# Patient Record
Sex: Male | Born: 1995 | Race: White | Hispanic: No | Marital: Single | Smoking: Current every day smoker
Health system: Southern US, Community
[De-identification: ages and names within clinical notes are randomized; demographics above are authoritative.]

## PROBLEM LIST (undated history)

## (undated) DIAGNOSIS — R454 Irritability and anger: Secondary | ICD-10-CM

## (undated) DIAGNOSIS — F909 Attention-deficit hyperactivity disorder, unspecified type: Secondary | ICD-10-CM

## (undated) DIAGNOSIS — J302 Other seasonal allergic rhinitis: Secondary | ICD-10-CM

## (undated) DIAGNOSIS — F17200 Nicotine dependence, unspecified, uncomplicated: Secondary | ICD-10-CM

## (undated) DIAGNOSIS — F419 Anxiety disorder, unspecified: Secondary | ICD-10-CM

## (undated) HISTORY — DX: Anxiety disorder, unspecified: F41.9

## (undated) HISTORY — DX: Nicotine dependence, unspecified, uncomplicated: F17.200

## (undated) HISTORY — DX: Irritability and anger: R45.4

## (undated) HISTORY — DX: Other seasonal allergic rhinitis: J30.2

## (undated) HISTORY — DX: Attention-deficit hyperactivity disorder, unspecified type: F90.9

---
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
## 2014-07-30 ENCOUNTER — Ambulatory Visit (INDEPENDENT_AMBULATORY_CARE_PROVIDER_SITE_OTHER): Payer: 59 | Admitting: Psychiatry

## 2014-07-30 ENCOUNTER — Encounter (HOSPITAL_COMMUNITY): Payer: Self-pay | Admitting: Psychiatry

## 2014-07-30 VITALS — BP 124/78 | HR 89 | Ht 68.5 in | Wt 156.0 lb

## 2014-07-30 DIAGNOSIS — F6381 Intermittent explosive disorder: Secondary | ICD-10-CM | POA: Diagnosis not present

## 2014-07-30 DIAGNOSIS — F1910 Other psychoactive substance abuse, uncomplicated: Secondary | ICD-10-CM | POA: Diagnosis not present

## 2014-07-30 DIAGNOSIS — F901 Attention-deficit hyperactivity disorder, predominantly hyperactive type: Secondary | ICD-10-CM

## 2014-07-30 MED ORDER — RISPERIDONE 1 MG PO TABS: 1.0000 mg | ORAL_TABLET | Freq: Two times a day (BID) | ORAL | Status: DC

## 2014-07-30 MED ORDER — MIRTAZAPINE 15 MG PO TABS: 15.0000 mg | ORAL_TABLET | Freq: Every day | ORAL | Status: DC
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
## 2014-08-03 ENCOUNTER — Other Ambulatory Visit (HOSPITAL_COMMUNITY): Payer: 59 | Attending: Psychiatry | Admitting: Licensed Clinical Social Worker

## 2014-08-03 ENCOUNTER — Encounter (HOSPITAL_COMMUNITY): Payer: Self-pay | Admitting: Psychology

## 2014-08-03 DIAGNOSIS — F17210 Nicotine dependence, cigarettes, uncomplicated: Secondary | ICD-10-CM | POA: Insufficient documentation

## 2014-08-03 DIAGNOSIS — F329 Major depressive disorder, single episode, unspecified: Secondary | ICD-10-CM | POA: Insufficient documentation

## 2014-08-03 DIAGNOSIS — F1020 Alcohol dependence, uncomplicated: Secondary | ICD-10-CM | POA: Insufficient documentation

## 2014-08-03 DIAGNOSIS — F901 Attention-deficit hyperactivity disorder, predominantly hyperactive type: Secondary | ICD-10-CM | POA: Insufficient documentation

## 2014-08-03 DIAGNOSIS — F6381 Intermittent explosive disorder: Secondary | ICD-10-CM | POA: Insufficient documentation

## 2014-08-03 DIAGNOSIS — F1910 Other psychoactive substance abuse, uncomplicated: Secondary | ICD-10-CM | POA: Insufficient documentation

## 2014-08-06 ENCOUNTER — Encounter (HOSPITAL_COMMUNITY): Payer: Self-pay | Admitting: Psychiatry

## 2014-08-06 ENCOUNTER — Ambulatory Visit (INDEPENDENT_AMBULATORY_CARE_PROVIDER_SITE_OTHER): Payer: 59 | Admitting: Psychiatry

## 2014-08-06 ENCOUNTER — Encounter (HOSPITAL_COMMUNITY): Payer: Self-pay | Admitting: Medical

## 2014-08-06 ENCOUNTER — Other Ambulatory Visit (HOSPITAL_COMMUNITY): Payer: 59 | Admitting: Licensed Clinical Social Worker

## 2014-08-06 VITALS — BP 127/80 | HR 92 | Ht 68.5 in | Wt 156.2 lb

## 2014-08-06 VITALS — BP 127/80 | HR 92 | Resp 16 | Ht 68.5 in | Wt 156.2 lb

## 2014-08-06 DIAGNOSIS — F902 Attention-deficit hyperactivity disorder, combined type: Secondary | ICD-10-CM

## 2014-08-06 DIAGNOSIS — F1910 Other psychoactive substance abuse, uncomplicated: Secondary | ICD-10-CM

## 2014-08-06 DIAGNOSIS — F901 Attention-deficit hyperactivity disorder, predominantly hyperactive type: Secondary | ICD-10-CM

## 2014-08-06 DIAGNOSIS — F321 Major depressive disorder, single episode, moderate: Secondary | ICD-10-CM

## 2014-08-06 DIAGNOSIS — R454 Irritability and anger: Secondary | ICD-10-CM

## 2014-08-06 DIAGNOSIS — F1020 Alcohol dependence, uncomplicated: Secondary | ICD-10-CM | POA: Diagnosis present

## 2014-08-06 DIAGNOSIS — F6381 Intermittent explosive disorder: Secondary | ICD-10-CM

## 2014-08-06 DIAGNOSIS — F329 Major depressive disorder, single episode, unspecified: Secondary | ICD-10-CM | POA: Diagnosis not present

## 2014-08-06 DIAGNOSIS — F17200 Nicotine dependence, unspecified, uncomplicated: Secondary | ICD-10-CM

## 2014-08-06 DIAGNOSIS — F17210 Nicotine dependence, cigarettes, uncomplicated: Secondary | ICD-10-CM | POA: Diagnosis not present

## 2014-08-06 DIAGNOSIS — F1029 Alcohol dependence with unspecified alcohol-induced disorder: Secondary | ICD-10-CM

## 2014-08-06 MED ORDER — RISPERIDONE 1 MG PO TABS: 1.0000 mg | ORAL_TABLET | Freq: Two times a day (BID) | ORAL | Status: DC

## 2014-08-06 MED ORDER — MIRTAZAPINE 15 MG PO TABS: 15.0000 mg | ORAL_TABLET | Freq: Every day | ORAL | Status: DC
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
## 2014-08-07 ENCOUNTER — Encounter (HOSPITAL_COMMUNITY): Payer: Self-pay | Admitting: Licensed Clinical Social Worker

## 2014-08-07 NOTE — Progress Notes (Signed)
    Daily Group Progress Note  Program: CD-IOP   Group Time: 1-2:30  Participation Level: Active  Behavioral Response: Appropriate  Type of Therapy: Process Group  Topic: After checking in with sobriety dates, group members took turns sharing about recovery-related activities and challenges they faced since the last group on Wednesday. They also shared any urges or cravings they may have experienced.  Group members were openly engaged in suggestions and feedback. A new patient joined the group and introduced himself. One group member graduated today from the program. The patient graduating received accolades from his fellow group members. His wife and son came to celebrate the graduation. Two group members saw program director, Maryjean Morn. The second half of group focused on styles of communication. Each patient was able to demonstrate through hands-on experience the relative effectiveness of communicating assertively, aggressively, passive-aggressively, and passively. Patients participated in a listening activity which is a major component of communication. Most patients were engaged in activities.      Group Time: 2:45-4  Participation Level: None  Behavioral Response: Appropriate  Type of Therapy: Psycho-education Group  Topic: The second half of group focused on styles of communication. Each patient was able to demonstrate through hands-on experience the relative effectiveness of communicating assertively, aggressively, passive-aggressively, and passively. Patients participated in a listening activity which is a major component of communication. Most patients were engaged in activities.      Summary:Today was patient's first day in the program. He introduced himself to the group. He reported he has a pending DWI and 2 felonies. The patient reported he used to live in [place] but was now staying with his parents. He was using alcohol and Xanax. He participated in the  communication activities but was quiet. Group members showed him where there are young people AA meetings in the community. He was receptive to the information. His sobriety date is 7/1.   Family Program: Family present? No   Name of family member(s):   UDS collected: No Results:   AA/NA attended?: No  Sponsor?: No   Harjit Leider S, Licensed Cli

## 2014-08-07 NOTE — Progress Notes (Signed)
Daily Group Progress Note  Program: CD-IOP   Group Time: 1-2:30  Participation Level: Active  Behavioral Response: Appropriate  Type of Therapy: Process Group  Topic: After checking in with sobriety dates, group members took turns sharing about recovery-related activities and challenges they faced since the last group. They also shared any urges or cravings they may have experienced.  Group members were openly engaged in suggestions and feedback. A new group member met with program director, Darlyne Russian, [place].    Group Time: 2:45-4  Participation Level: Minimal  Behavioral Response: Appropriate  Type of Therapy: Psycho-education Group  Topic: The second half of group focused on Chemical Addiction and the family. Group members were able to acknowledge how their own addiction affected their family. Group members discovered emotions family members may have felt while they were in active addiction. Group members reported how the use of chemicals had dominated their thoughts, their time and their attention; and they didn't even realize it. There was good group discussion surrounding these emotions.    Summary: Patient reported a lot of family time over the weekend. He did have an urge to drink while watching the race on Sunday. He has not been to any meetings yet. He asked the group for assistance in [place] meetings for young people. Group gave good information to patient. Patient reported he has been to 1 AA meeting in his life. Group members gave him information on expectations at 12-step meetings. Patient participated minimally during the discussion on family and addiction. He had a psychiatrist appointment and an intake appointment with the CD-IOP program director Darlyne Russian, PA during group. Patient will begin to open up more the longer he is in the program. His sobriety date is 7/15.     Family Program: Family present? No   Name of family member(s):   UDS collected: No  Results:   AA/NA attended?: No  Sponsor?: No   Clayton Mason S, Licensed Cli

## 2014-08-08 ENCOUNTER — Encounter (HOSPITAL_COMMUNITY): Payer: Self-pay | Admitting: Licensed Clinical Social Worker

## 2014-08-08 ENCOUNTER — Other Ambulatory Visit (HOSPITAL_COMMUNITY): Payer: 59 | Admitting: Licensed Clinical Social Worker

## 2014-08-08 DIAGNOSIS — F1020 Alcohol dependence, uncomplicated: Secondary | ICD-10-CM | POA: Diagnosis not present

## 2014-08-08 DIAGNOSIS — F1011 Alcohol abuse, in remission: Secondary | ICD-10-CM | POA: Insufficient documentation

## 2014-08-09 ENCOUNTER — Other Ambulatory Visit (HOSPITAL_COMMUNITY): Payer: 59 | Admitting: Licensed Clinical Social Worker

## 2014-08-09 ENCOUNTER — Encounter (HOSPITAL_COMMUNITY): Payer: Self-pay | Admitting: Licensed Clinical Social Worker

## 2014-08-09 DIAGNOSIS — F1910 Other psychoactive substance abuse, uncomplicated: Secondary | ICD-10-CM

## 2014-08-09 DIAGNOSIS — F1020 Alcohol dependence, uncomplicated: Secondary | ICD-10-CM | POA: Diagnosis not present

## 2014-08-09 NOTE — Progress Notes (Signed)
    Daily Group Progress Note  Program: CD-IOP   Group Time: 1-2:30  Participation Level: Active  Behavioral Response: Appropriate and Sharing  Type of Therapy: Process Group  Topic: After checking in with sobriety dates, group members took turns sharing about recovery-related activities and challenges they faced since the last group. They also shared any urges or cravings they may have experienced.  Group members were openly engaged in suggestions and feedback. A new patient joined the group and introduced herself.    Group Time: 2:45-4  Participation Level: Active  Behavioral Response: Appropriate and Sharing  Type of Therapy: Psycho-education Group  Topic: The second part of group focused on trauma and substance abuse. A guest speaker, Dr. Fleeta Emmer, from [facility] presented through a power point how addiction predisposes people to higher rates of traumas. Inversely, people with trauma also have higher rates of addiction. Dr. Luiz Blare also discussed her foundation and what services are provided:  counseling, case management, skill-building groups, and co-occurring disorder counseling. She gave patients informational material on trauma.       Summary: Patient reported he went back to [place] on Tuesday because his girlfriend is back from [place]. He reported he went to an [employer] in [place]. He went to court today for a misdemeanor and he was fined. He had a craving to use after court. He went home and went to sleep to deal with the craving.  He reported he also has several pending felony charges and is worried about going to court again. Patient reports he will begin going to meetings in [place]. Patient did not participate in the trauma discussion. Patient is new in recovery. His sobriety date is 7/15.    Family Program: Family present? No   Name of family member(s):   UDS collected: No Results:   AA/NA attended?: No  Sponsor?:  No   Keithon Mccoin S, Licensed Cli

## 2014-08-10 ENCOUNTER — Encounter (HOSPITAL_COMMUNITY): Payer: Self-pay | Admitting: Licensed Clinical Social Worker

## 2014-08-10 ENCOUNTER — Other Ambulatory Visit (HOSPITAL_COMMUNITY): Payer: 59

## 2014-08-10 NOTE — Progress Notes (Signed)
    Daily Group Progress Note  Program: CD-IOP   Group Time: 1-2:30  Participation Level: Active  Behavioral Response: Appropriate and Sharing  Type of Therapy: Process Group  Topic: After checking in with sobriety dates, group members took turns sharing about recovery-related activities and challenges they faced since the last group. They also shared any urges or cravings they may have experienced.  Group members were openly engaged in suggestions and feedback. One patient is struggling with early sobriety. He received good feedback and suggestions from other group members. Two patients were given prescriptions to go to [facility] for drug tests.    Group Time: 2:45-4  Participation Level: Active  Behavioral Response: Appropriate and Sharing  Type of Therapy: Psycho-education Group  Topic: The second half of group was psychoeducation. The focus was family roles in an addicted family. Each member learned about roles family members take on when there is addiction in the family to create balance in a dysfunctional family. The roles: addict, hero, mascot, lost child, scapegoat and enabler. Next, group members displayed a family structure portraying what their family looked like metaphorically when the patient was between the ages of 8-10. The segment was very powerful as patients explained, by sculpture, what they remember about their early childhood years. Lots of feelings were shown. There was open and honest conversation about family.      Summary:  Patient honestly reported he has had urges to drink since the last group yesterday so he went to a meeting and saw another group member. He felt more comfortable seeing another group member. Patient participated in the family structure activity. He formed his structure with 6 family members: parents together, 2 brothers together, patient and his sister together. He reported his sister came to live in their family at age 83 and she was  shunned by his family. She began to run away and the patient was always looking for her. He portrayed this in his structure. He was emotional when he talked about his family. He participated well in the intervention. His sobriety date is 7/15.  Family Program: Family present? No   Name of family member(s):   UDS collected: No Results:   AA/NA attended?: YesWednesday  Sponsor?: No   Gradie Butrick S, Licensed Cli

## 2014-08-13 ENCOUNTER — Other Ambulatory Visit (HOSPITAL_COMMUNITY): Payer: 59 | Attending: Psychiatry | Admitting: Psychology

## 2014-08-13 DIAGNOSIS — F1020 Alcohol dependence, uncomplicated: Secondary | ICD-10-CM | POA: Insufficient documentation

## 2014-08-13 DIAGNOSIS — F1910 Other psychoactive substance abuse, uncomplicated: Secondary | ICD-10-CM | POA: Insufficient documentation

## 2014-08-13 DIAGNOSIS — F17210 Nicotine dependence, cigarettes, uncomplicated: Secondary | ICD-10-CM | POA: Diagnosis not present

## 2014-08-14 ENCOUNTER — Emergency Department (HOSPITAL_COMMUNITY): Payer: 59

## 2014-08-14 ENCOUNTER — Emergency Department (HOSPITAL_COMMUNITY)
Admission: EM | Admit: 2014-08-14 | Discharge: 2014-08-14 | Disposition: A | Discharge status: Home / Self Care | Payer: 59 | Attending: Emergency Medicine | Admitting: Emergency Medicine

## 2014-08-14 ENCOUNTER — Encounter (HOSPITAL_COMMUNITY): Payer: Self-pay | Admitting: Emergency Medicine

## 2014-08-14 DIAGNOSIS — S59902A Unspecified injury of left elbow, initial encounter: Secondary | ICD-10-CM | POA: Diagnosis not present

## 2014-08-14 DIAGNOSIS — F419 Anxiety disorder, unspecified: Secondary | ICD-10-CM | POA: Insufficient documentation

## 2014-08-14 DIAGNOSIS — S79911A Unspecified injury of right hip, initial encounter: Secondary | ICD-10-CM | POA: Diagnosis not present

## 2014-08-14 DIAGNOSIS — Y9389 Activity, other specified: Secondary | ICD-10-CM | POA: Insufficient documentation

## 2014-08-14 DIAGNOSIS — Z720 Tobacco use: Secondary | ICD-10-CM | POA: Diagnosis not present

## 2014-08-14 DIAGNOSIS — Z8709 Personal history of other diseases of the respiratory system: Secondary | ICD-10-CM | POA: Insufficient documentation

## 2014-08-14 DIAGNOSIS — Y999 Unspecified external cause status: Secondary | ICD-10-CM | POA: Diagnosis not present

## 2014-08-14 DIAGNOSIS — M25551 Pain in right hip: Secondary | ICD-10-CM

## 2014-08-14 DIAGNOSIS — Z79899 Other long term (current) drug therapy: Secondary | ICD-10-CM | POA: Diagnosis not present

## 2014-08-14 DIAGNOSIS — Y92410 Unspecified street and highway as the place of occurrence of the external cause: Secondary | ICD-10-CM | POA: Insufficient documentation

## 2014-08-14 MED ORDER — NAPROXEN 500 MG PO TABS: 500.0000 mg | ORAL_TABLET | Freq: Two times a day (BID) | ORAL | Status: DC

## 2014-08-14 MED ORDER — FENTANYL CITRATE (PF) 100 MCG/2ML IJ SOLN
25.0000 ug | Freq: Once | INTRAMUSCULAR | Status: AC
  Administered 2014-08-14: 25 ug via INTRAMUSCULAR
  Filled 2014-08-14: qty 2

## 2014-08-14 MED ORDER — OXYCODONE-ACETAMINOPHEN 5-325 MG PO TABS: 2.0000 | ORAL_TABLET | Freq: Three times a day (TID) | ORAL | Status: DC | PRN

## 2014-08-14 NOTE — Discharge Instructions (Signed)
Hip Pain Follow up with her primary care physician for repeat x-rays within 6 months. Your hip is the joint between your upper legs and your lower pelvis. The bones, cartilage, tendons, and muscles of your hip joint perform a lot of work each day supporting your body weight and allowing you to move around. Hip pain can range from a minor ache to severe pain in one or both of your hips. Pain may be felt on the inside of the hip joint near the groin, or the outside near the buttocks and upper thigh. You may have swelling or stiffness as well.  HOME CARE INSTRUCTIONS   Take medicines only as directed by your health care provider.  Apply ice to the injured area:  Put ice in a plastic bag.  Place a towel between your skin and the bag.  Leave the ice on for 15-20 minutes at a time, 3-4 times a day.  Keep your leg raised (elevated) when possible to lessen swelling.  Avoid activities that cause pain.  Follow specific exercises as directed by your health care provider.  Sleep with a pillow between your legs on your most comfortable side.  Record how often you have hip pain, the location of the pain, and what it feels like. SEEK MEDICAL CARE IF:   You are unable to put weight on your leg.  Your hip is red or swollen or very tender to touch.  Your pain or swelling continues or worsens after 1 week.  You have increasing difficulty walking.  You have a fever. SEEK IMMEDIATE MEDICAL CARE IF:   You have fallen.  You have a sudden increase in pain and swelling in your hip. MAKE SURE YOU:   Understand these instructions.  Will watch your condition.  Will get help right away if you are not doing well or get worse. Document Released: 06/18/2009 Document Revised: 05/15/2013 Document Reviewed: 08/25/2012 [facility] Patient Information 2015 [place], [place]. This information is not intended to replace advice given to you by your health care provider. Make sure you discuss any questions you  have with your health care provider.
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
## 2014-08-15 ENCOUNTER — Encounter (HOSPITAL_COMMUNITY): Payer: Self-pay | Admitting: Psychology

## 2014-08-15 ENCOUNTER — Other Ambulatory Visit (HOSPITAL_COMMUNITY): Payer: 59 | Admitting: Psychology

## 2014-08-15 NOTE — Progress Notes (Signed)
Daily Group Progress Note  Program: CD-IOP   Group Time: 1-2:30 pm  Participation Level: Active  Behavioral Response: Sharing  Type of Therapy: Process Group  Topic: Process; the first part of group was spent in process. Members shared bout any issues or concerns that had presented themselves in early recovery. During the check-in, members identified the things they had done to support their recovery over the weekend. The medical director met with patients today during this session.   Group Time: 2:45- 4pm  Participation Level: Active  Behavioral Response: Appropriate  Type of Therapy: Psycho-education Group/Graduation  Topic: Family Sculpture/Graduation: the second half of group was spent in a psycho-ed. This session was a continuation of the Family Sculpture that was begun last week. The 3 remaining members that had yet to complete these were asked to 'sculpt' their families to reflect the nature of their family at some time during their childhoods. For one member, it was a very painful sculpture of her childhood while for another; a very happy family was depicted with lots of love, support and validation. This session ended with a graduation ceremony honoring a member who was leaving the program successfully. Kind words were shared with the graduating member and he expressed his appreciation for what he had learned from his fellow group members and he encouraged them to continue on their journeys as he intends to.   Summary: The patient reported he had attended 2 AA meetings over the weekend. He had travelled to [place] to visit with his girlfriend and they had enjoyed their time together. He laughed as he described going out to dinner and wanting a steak only to find that they had gone to a Vegan restaurant. The patient shared that he had court dates in [place] on Wednesday and Thursday. He won't be in group on Wednesday, but his Thursday DWI date will be continued so he hopes  to get back here in time for the group session on Thursday. The patient was involved in the family sculptures and provided some good feedback about how it felt to play the role. He thanked the graduating member for being so welcoming and kind to him and wished him well.  The patient was attentive and engaged, but remains somewhat closed and withdrawn from sharing his feelings with his fellow group members. 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 He responded well to this intervention and his sobriety date is 7/15.    Family Program: Family present? No   Name of family member(s):   UDS collected: No Results:  AA/NA attended?: YesFriday and Saturday  Sponsor?: No   ,, LCAS        

## 2014-08-16 ENCOUNTER — Other Ambulatory Visit (HOSPITAL_COMMUNITY): Payer: 59 | Admitting: Psychology

## 2014-08-16 DIAGNOSIS — F1020 Alcohol dependence, uncomplicated: Secondary | ICD-10-CM | POA: Diagnosis not present

## 2014-08-17 ENCOUNTER — Encounter (HOSPITAL_COMMUNITY): Payer: Self-pay | Admitting: Psychology

## 2014-08-17 ENCOUNTER — Other Ambulatory Visit (HOSPITAL_COMMUNITY): Payer: 59

## 2014-08-17 ENCOUNTER — Other Ambulatory Visit (HOSPITAL_COMMUNITY): Payer: Self-pay | Admitting: *Deleted

## 2014-08-17 NOTE — Progress Notes (Signed)
Clayton Mason. is a 19 y.o. male patient. Orientation to CD-IOP: The patient is a 19 yo single, white, male seeking entry into the CD-IOP. He lives in [place] with his parents and younger brother.  The patient's father first contacted me about his son. Most recently, this young man had a DWI on July 1rst. The family had sought residential treatment for him, but he has current pending legal charges and was told by the DA that he cannot leave the area or enter any restricted residential facility until these cases are addressed. As a result, the patient has sought outpatient treatment and ended up here. (They did meet with the staff at [facility], but couldn't afford the $4,500 upfront fee).  The patient has an extensive history of alcohol and drug use that began in his mid-teens. He began drinking and smoking cannabis at age 33. He reports drinking 3-4 days per week while his cannabis use is daily. His other drug use, although extensive, is limited to much less frequent use. In addition to his addiction, though, the patient has a long history of behavioral outbursts that occur only when he is drinking. In addition to the pending DWI charge, he has 5 other charges pending that all occurred when he was drunk. The patient admitted that when he is drinking, he becomes argumentative, loud and will frequently end up fighting with subsequent legal charges. When asked about any periods of sobriety, the patient reported he had been totally sober for 2 months about a year ago. He explained that he was broke and couldn't afford anything. The patient's paternal side of the family is where the addiction resides, including his father, paternal grandfather and great grandmother, in addition to a long list of paternal uncles. The patient met with Dr. Salem Senate here at [facility] this past Monday, July 18th. 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  Today, the  patient reported that he is taking it as prescribed and is feeling better. He was sleepy the first few days, but noted that this side effect has subsided.  He is scheduled to meet with her again on Monday, the 25th. The patient reported he had not had anything to drink for a week, but admitted he smoked cannabis yesterday. I explained that this was a program of total abstinence and random drug tests will identify further drug use. The patient was pleasant and appeared very open about his drug use and the serious legal circumstances he now faces. The documentation was reviewed, signed and completed accordingly. He will return this afternoon and begin the CD-IOP.         Heiress Williamson, LCAS

## 2014-08-17 NOTE — Telephone Encounter (Signed)
Refill request for Risperidone. Chart reviewed, refill given 08/06/14, too soon for another refill.
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
## 2014-08-19 ENCOUNTER — Encounter (HOSPITAL_COMMUNITY): Payer: Self-pay | Admitting: Psychology

## 2014-08-19 DIAGNOSIS — F1020 Alcohol dependence, uncomplicated: Secondary | ICD-10-CM | POA: Insufficient documentation
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
## 2014-08-20 ENCOUNTER — Other Ambulatory Visit (HOSPITAL_COMMUNITY): Payer: 59 | Admitting: Psychology

## 2014-08-20 DIAGNOSIS — F1020 Alcohol dependence, uncomplicated: Secondary | ICD-10-CM

## 2014-08-20 DIAGNOSIS — F6381 Intermittent explosive disorder: Secondary | ICD-10-CM

## 2014-08-21 ENCOUNTER — Encounter (HOSPITAL_COMMUNITY): Payer: Self-pay | Admitting: Licensed Clinical Social Worker

## 2014-08-21 ENCOUNTER — Telehealth (HOSPITAL_COMMUNITY): Payer: Self-pay

## 2014-08-21 NOTE — Telephone Encounter (Signed)
Met with Binnie Rail from [facility] who stated patient had been unable to fill his Risperdal and Remeron Dr. Salem Senate prescribed on 08/06/14.  Patient reported he was not given a prescription on 08/06/14 for Risperdal and requested [facility].  [facility]ed [facility] and spoke with [place] who verified they had the Remeron order from that date but was too early to fill as he had filled this on 07/30/14.  Pharmacist did not have the Risperdal order and gave them the order by phone for Dr. Juliane Lack order dated 08/06/14 as authorized.  Patient instructed was too early to fill his Remeron but could in a week but could now pick up the Risperdal and will call back if any problems.

## 2014-08-22 ENCOUNTER — Other Ambulatory Visit (HOSPITAL_COMMUNITY): Payer: 59 | Admitting: Psychology

## 2014-08-22 DIAGNOSIS — F1020 Alcohol dependence, uncomplicated: Secondary | ICD-10-CM | POA: Diagnosis not present

## 2014-08-22 DIAGNOSIS — F6381 Intermittent explosive disorder: Secondary | ICD-10-CM

## 2014-08-22 NOTE — Telephone Encounter (Signed)
Thank you :)

## 2014-08-23 ENCOUNTER — Encounter (HOSPITAL_COMMUNITY): Payer: Self-pay | Admitting: Licensed Clinical Social Worker

## 2014-08-23 ENCOUNTER — Encounter (HOSPITAL_COMMUNITY): Payer: Self-pay | Admitting: Psychology

## 2014-08-23 NOTE — Progress Notes (Signed)
    Daily Group Progress Note  Program: CD-IOP   Group Time: 1-2:30 pm  Participation Level: Active  Behavioral Response: Sharing  Type of Therapy: Process Group  Topic: Process: the first part of group was spent in process. Members shared about the challenges of early recovery over this past weekend. Members are asked to identify what they did to support their recovery. While one member attended 4 12-step meetings, other members attended none. A difficult housing situation was discussed with her fellow group members providing support, good feedback and, most importantly, patience. During group today, the Market researcher met with the newest group member. She had left in the middle of last Monday's session because of a migraine and was unable to meet with him a scheduled.   Group Time: 2:45- 4pm  Participation Level: Active  Behavioral Response: Appropriate and Sharing  Type of Therapy: Psycho-education Group  Topic: Bio-Psycho-Social-Spiritual: the second half of group was spent in a psycho-ed on the 4 elements believed to contribute and lead to addiction. While everyone present in the group has the biological predisposition, the other 3 elements were quite different for each group member. Members discussed how each of these 4 elements had manifested in their lives. Of the 7 group members present today, only 1 member had not begun drinking/drugging in her early to mid-teens.   Summary: The patient reported he had gone to [place] to visit his girlfriend this weekend. His mother had taken him down to [place] on Friday and he had court. The case was continued. When asked about meetings, he stated he had attended 2 AA meetings at a [facility]. One meeting was a Big Book discussion while the other one was a Geophysicist/field seismologist. The patient also reported he had worked out for 90 minutes on Saturday and he had felt really good. He agreed with another member that he would follow a  consistent program of exercise throughout the week. During the second part of group today, the patient reported all of his friends had used alcohol and drugs beginning in high school. The only friends he had used and he didn't really know anybody that didn't use. When asked about his girlfriend's drug use, he admitted that she doesn't really drink, but she does smoke pot. He has asked her not to smoke around him and she has respected this request. The patient reported that a childhood friend of his visited with him this weekend. The friend noted how very different the patient is when he is drinking compared to when he is not drinking".  We talked about the psychological issues that contribute to addiction. This patient stated that his terrible problem with anger was inherited from his father. Another member wondered if perhaps he had just learned that way of dealing with frustration by watching his father's reactions and behaviors? Other members concurred, but this fellow repeated what his psychiatrist had taught him. The patient made some good comments and presented a little more openly about himself and feelings during group today. He responded well to this intervention and his sobriety date remains 7/15.    Family Program: Family present? No   Name of family member(s):   UDS collected: No Results:  AA/NA attended?: YesFriday and Saturday  Sponsor?: No   Keymarion Bearman, LCAS

## 2014-08-24 ENCOUNTER — Encounter (HOSPITAL_COMMUNITY): Payer: Self-pay | Admitting: Psychology

## 2014-08-24 ENCOUNTER — Other Ambulatory Visit (HOSPITAL_COMMUNITY): Payer: 59

## 2014-08-24 NOTE — Progress Notes (Signed)
Clayton Mason. is a 19 y.o. male patient. CD-IOP Family Counseling Session: Met with Pt and his father today to discuss his opiate medication given to him the emergency dept for pain. Pt has an appt later today with an orthopedic surgeon to discuss 2 bone spurs. The ED dr states they are either 2 bone spurs or 2 tumors. Pt reports he has taken all of the Percocet. He reports he has lots of pain. Told pt and father he can not be in our program and continue to take the pain meds. He will go to his dr appt today and report tomorrow in group.         Clayton Mason, Licensed Cli

## 2014-08-24 NOTE — Progress Notes (Signed)
Clayton Mason. is a 19 y.o. male patient. Discharge planning: Pt is being unsucessfully  Discharged from the CD-IOP program. He saw his orthopedic yesterday and he needs other tests. The dr gave him another prescription of percocet. Pt understands why he is being discharged and would like to return upon completion of medical issues.        Holly Iannaccone S, Licensed Cli

## 2014-08-24 NOTE — Progress Notes (Signed)
Daily Group Progress Note  Program: CD-IOP   Group Time: 1-2:30 pm  Participation Level: Active  Behavioral Response: Appropriate and Sharing  Type of Therapy: Psycho-education Group  Topic: Psycho-Ed: the first part of group was spent with a guest from the chaplaincy department. He was introduced and group members introduced themselves to our guest. He shared about having come here to speak with groups in the past and how, as a recovering addict, he enjoyed being with this group. The guest went on to explain about feelings and how "everything you feel is going to come out. One will either act it out or tell it". The problem is that we often 'act them out' and acting them out is usually confusing. He suggested that by verbalizing our feelings we avoid the 'explosions' that prove so painful and damaging in our lives and relationships. The exercise he led included passing around a small polished rock and saying to the person next to you, "Sometimes I pretend to.". It proved to be an extremely powerful session.  Group Time: 2:45- 4pm  Participation Level: Active  Behavioral Response: Appropriate  Type of Therapy: Process Group  Topic: Process: the second half of group was spent in process. Members shared about their experience with the chaplain and what they discovered and felt during the session. One member had actually been too frightened to share anything. The importance of disclosure, of opening up what one has kept in, is the therapeutic process that begins to 'free' one from the chains of addiction. It will be essential to open up to allow the recovery process to begin.   Summary: The patient was attentive and engaged in the session with the chaplain. When the rock came to him, the patient looked at the member next to him and shared, "I am angry at myself for what I have done with my life. And I am really scared about what is going to happen to me". It was a powerful statement and  the most revealing he has made since entering the program. 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 The surgeon was going to schedule the patient for an MRI and then they would meet again to discuss treatment options. The physician did not want to actually diagnose what exactly is going on with the patient and explained that with the MRI, they would know more exactly just what they are dealing with ,. The patient admitted he had been prescribed hydrocodone, but that "I had told him that I was in this program", but he prescribed the meds because I am hurting so much.  He admitted that he had taken a pill last night. I explained that we were certainly not opposed to him taking medication to address his pain, but that he could not remain in this program while taking the medication. We agreed that he would be discharged at this time, but that he would return when he is no longer on the meds or the treatment does not entail narcotics. The patient reported, "I really need this program and I want to come back". His group member urged him to remain in contact and that they wanted to know what was going on with him. The patient  promised them that he would remain in contact with his counselor and keep them informed. The patient will be discharged from this program, but he will be allowed  to return once he is no longer taking narcotic pain medications. The patient had announced that his sobriety date remained 7/15, but he had taken the pain medication before consulting [place] and that would be regarded as a slip. We will wait to hear from him.   Family Program: Family present? No   Name of family member(s):   UDS collected: No Results:  AA/NA attended?: No  Sponsor?: No   Clayton Mason, LCAS

## 2014-08-27 ENCOUNTER — Other Ambulatory Visit (HOSPITAL_COMMUNITY): Payer: 59

## 2014-08-29 ENCOUNTER — Other Ambulatory Visit (HOSPITAL_COMMUNITY): Payer: 59

## 2014-08-31 ENCOUNTER — Other Ambulatory Visit (HOSPITAL_COMMUNITY): Payer: 59

## 2014-09-03 ENCOUNTER — Other Ambulatory Visit (HOSPITAL_COMMUNITY): Payer: 59

## 2014-09-05 ENCOUNTER — Other Ambulatory Visit (HOSPITAL_COMMUNITY): Payer: 59

## 2014-09-07 ENCOUNTER — Other Ambulatory Visit (HOSPITAL_COMMUNITY): Payer: 59

## 2014-09-10 ENCOUNTER — Other Ambulatory Visit (HOSPITAL_COMMUNITY): Payer: 59

## 2014-09-12 ENCOUNTER — Other Ambulatory Visit (HOSPITAL_COMMUNITY): Payer: 59

## 2014-09-14 ENCOUNTER — Other Ambulatory Visit (HOSPITAL_COMMUNITY): Payer: 59

## 2014-09-18 ENCOUNTER — Ambulatory Visit (HOSPITAL_COMMUNITY): Payer: Self-pay | Admitting: Psychiatry

## 2014-09-19 ENCOUNTER — Other Ambulatory Visit (HOSPITAL_COMMUNITY): Payer: 59

## 2014-09-21 ENCOUNTER — Other Ambulatory Visit (HOSPITAL_COMMUNITY): Payer: 59

## 2014-09-24 ENCOUNTER — Other Ambulatory Visit (HOSPITAL_COMMUNITY): Payer: 59

## 2014-09-26 ENCOUNTER — Other Ambulatory Visit (HOSPITAL_COMMUNITY): Payer: 59

## 2014-09-28 ENCOUNTER — Other Ambulatory Visit (HOSPITAL_COMMUNITY): Payer: 59

## 2014-10-01 ENCOUNTER — Other Ambulatory Visit (HOSPITAL_COMMUNITY): Payer: 59

## 2014-10-03 ENCOUNTER — Other Ambulatory Visit (HOSPITAL_COMMUNITY): Payer: 59

## 2014-10-05 ENCOUNTER — Other Ambulatory Visit (HOSPITAL_COMMUNITY): Payer: 59

## 2014-10-08 ENCOUNTER — Other Ambulatory Visit (HOSPITAL_COMMUNITY): Payer: 59

## 2014-10-10 ENCOUNTER — Other Ambulatory Visit (HOSPITAL_COMMUNITY): Payer: 59

## 2014-10-12 ENCOUNTER — Other Ambulatory Visit (HOSPITAL_COMMUNITY): Payer: 59

## 2014-10-15 ENCOUNTER — Other Ambulatory Visit (HOSPITAL_COMMUNITY): Payer: 59

## 2014-10-17 ENCOUNTER — Other Ambulatory Visit (HOSPITAL_COMMUNITY): Payer: 59

## 2014-10-19 ENCOUNTER — Other Ambulatory Visit (HOSPITAL_COMMUNITY): Payer: 59

## 2014-10-22 ENCOUNTER — Other Ambulatory Visit (HOSPITAL_COMMUNITY): Payer: 59

## 2014-10-24 ENCOUNTER — Other Ambulatory Visit (HOSPITAL_COMMUNITY): Payer: 59

## 2014-10-26 ENCOUNTER — Other Ambulatory Visit (HOSPITAL_COMMUNITY): Payer: 59

## 2014-10-29 ENCOUNTER — Other Ambulatory Visit (HOSPITAL_COMMUNITY): Payer: 59

## 2014-10-31 ENCOUNTER — Other Ambulatory Visit (HOSPITAL_COMMUNITY): Payer: 59

## 2014-11-02 ENCOUNTER — Other Ambulatory Visit (HOSPITAL_COMMUNITY): Payer: 59

## 2015-05-03 ENCOUNTER — Ambulatory Visit (INDEPENDENT_AMBULATORY_CARE_PROVIDER_SITE_OTHER): Payer: 59 | Admitting: Family Medicine

## 2015-05-03 ENCOUNTER — Encounter: Payer: Self-pay | Admitting: Family Medicine

## 2015-05-03 VITALS — BP 130/84 | HR 92 | Temp 98.3°F | Wt 166.8 lb

## 2015-05-03 DIAGNOSIS — F1011 Alcohol abuse, in remission: Secondary | ICD-10-CM

## 2015-05-03 DIAGNOSIS — F901 Attention-deficit hyperactivity disorder, predominantly hyperactive type: Secondary | ICD-10-CM | POA: Diagnosis not present

## 2015-05-03 DIAGNOSIS — F1010 Alcohol abuse, uncomplicated: Secondary | ICD-10-CM

## 2015-05-03 DIAGNOSIS — F6381 Intermittent explosive disorder: Secondary | ICD-10-CM

## 2015-05-03 DIAGNOSIS — F321 Major depressive disorder, single episode, moderate: Secondary | ICD-10-CM | POA: Diagnosis not present

## 2015-05-03 DIAGNOSIS — Z720 Tobacco use: Secondary | ICD-10-CM | POA: Diagnosis not present

## 2015-05-03 DIAGNOSIS — Z113 Encounter for screening for infections with a predominantly sexual mode of transmission: Secondary | ICD-10-CM

## 2015-05-03 DIAGNOSIS — F17200 Nicotine dependence, unspecified, uncomplicated: Secondary | ICD-10-CM

## 2015-05-03 MED ORDER — MIRTAZAPINE 15 MG PO TABS: 15.0000 mg | ORAL_TABLET | Freq: Every day | ORAL | Status: DC

## 2015-05-03 MED ORDER — AMITRIPTYLINE HCL 50 MG PO TABS: 50.0000 mg | ORAL_TABLET | Freq: Two times a day (BID) | ORAL | Status: DC
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
## 2015-05-03 NOTE — Progress Notes (Signed)
Pre visit review using our clinic review tool, if applicable. No additional management support is needed unless otherwise documented below in the visit note. 

## 2015-05-03 NOTE — Assessment & Plan Note (Signed)
Completed 28d rehab 01/2015. Abstinent from EtOH. Doing well. Encouraged full cessation.

## 2015-05-03 NOTE — Patient Instructions (Addendum)
I'm glad you're doing well today. Continue medicines - refilled for 1 month with several refills. Return to see Dr Rutherford Limerickadepalli to touch base. If she feels things are stable, we can continue following you here.  labwork and urine check today.  Return as needed or in 1 year for a physical.

## 2015-05-03 NOTE — Addendum Note (Signed)
Addended by: Alvina ChouWALSH, Maydelin Deming J on: 05/03/2015 10:03 AM   Modules accepted: Orders

## 2015-05-03 NOTE — Assessment & Plan Note (Signed)
Contemplative. Continue to encourage cessation. Consider wellbutrin.

## 2015-05-03 NOTE — Assessment & Plan Note (Signed)
With anxiety - meds refilled. Suggested return for f/u with psych and if deemed stable ok to continue f/u with PCP. Pt and dad agree with plan.

## 2015-05-03 NOTE — Assessment & Plan Note (Signed)
Suggested f/u with psych.

## 2015-05-03 NOTE — Assessment & Plan Note (Signed)
Stable period. Suggested f/u with psych

## 2015-05-04 LAB — GC/CHLAMYDIA PROBE AMP
CT Probe RNA: NOT DETECTED
GC Probe RNA: NOT DETECTED

## 2015-05-04 LAB — RPR

## 2015-05-04 LAB — HIV ANTIBODY (ROUTINE TESTING W REFLEX): HIV: NONREACTIVE

## 2015-05-06 ENCOUNTER — Encounter: Payer: Self-pay | Admitting: *Deleted

## 2015-05-20 ENCOUNTER — Other Ambulatory Visit: Payer: Self-pay | Admitting: *Deleted

## 2015-05-20 MED ORDER — AMITRIPTYLINE HCL 50 MG PO TABS: 50.0000 mg | ORAL_TABLET | Freq: Two times a day (BID) | ORAL | Status: DC

## 2015-05-20 NOTE — Telephone Encounter (Signed)
Ok to refill to mail order? 

## 2015-05-21 ENCOUNTER — Other Ambulatory Visit: Payer: Self-pay | Admitting: *Deleted

## 2015-05-21 MED ORDER — MIRTAZAPINE 15 MG PO TABS: 15.0000 mg | ORAL_TABLET | Freq: Every day | ORAL | Status: DC

## 2015-05-21 NOTE — Telephone Encounter (Signed)
Sent in one refill. I did ask pt return to Dr Rutherford Limerickadepalli and if ok by her we could continue meds.

## 2015-05-21 NOTE — Telephone Encounter (Signed)
Ok to refill to mail order? 

## 2015-10-15 ENCOUNTER — Other Ambulatory Visit: Payer: Self-pay | Admitting: Family Medicine

## 2015-10-16 NOTE — Telephone Encounter (Signed)
Ok to refill? Last filled 05/21/15 #90 1 RF

## 2016-03-03 ENCOUNTER — Encounter (HOSPITAL_COMMUNITY): Payer: Self-pay | Admitting: Family Medicine

## 2016-03-03 ENCOUNTER — Ambulatory Visit (HOSPITAL_COMMUNITY)
Admission: EM | Admit: 2016-03-03 | Discharge: 2016-03-03 | Disposition: A | Discharge status: Home / Self Care | Payer: 59 | Attending: Internal Medicine | Admitting: Internal Medicine

## 2016-03-03 DIAGNOSIS — Z825 Family history of asthma and other chronic lower respiratory diseases: Secondary | ICD-10-CM | POA: Insufficient documentation

## 2016-03-03 DIAGNOSIS — Z811 Family history of alcohol abuse and dependence: Secondary | ICD-10-CM | POA: Diagnosis not present

## 2016-03-03 DIAGNOSIS — Z818 Family history of other mental and behavioral disorders: Secondary | ICD-10-CM | POA: Diagnosis not present

## 2016-03-03 DIAGNOSIS — Z823 Family history of stroke: Secondary | ICD-10-CM | POA: Insufficient documentation

## 2016-03-03 DIAGNOSIS — R109 Unspecified abdominal pain: Secondary | ICD-10-CM | POA: Insufficient documentation

## 2016-03-03 DIAGNOSIS — Z79899 Other long term (current) drug therapy: Secondary | ICD-10-CM | POA: Diagnosis not present

## 2016-03-03 DIAGNOSIS — J302 Other seasonal allergic rhinitis: Secondary | ICD-10-CM | POA: Diagnosis not present

## 2016-03-03 DIAGNOSIS — Z800 Family history of malignant neoplasm of digestive organs: Secondary | ICD-10-CM | POA: Diagnosis not present

## 2016-03-03 DIAGNOSIS — F329 Major depressive disorder, single episode, unspecified: Secondary | ICD-10-CM | POA: Diagnosis not present

## 2016-03-03 DIAGNOSIS — Z808 Family history of malignant neoplasm of other organs or systems: Secondary | ICD-10-CM | POA: Insufficient documentation

## 2016-03-03 DIAGNOSIS — Z803 Family history of malignant neoplasm of breast: Secondary | ICD-10-CM | POA: Insufficient documentation

## 2016-03-03 DIAGNOSIS — Z8051 Family history of malignant neoplasm of kidney: Secondary | ICD-10-CM | POA: Insufficient documentation

## 2016-03-03 DIAGNOSIS — Z8249 Family history of ischemic heart disease and other diseases of the circulatory system: Secondary | ICD-10-CM | POA: Insufficient documentation

## 2016-03-03 DIAGNOSIS — F1010 Alcohol abuse, uncomplicated: Secondary | ICD-10-CM | POA: Diagnosis not present

## 2016-03-03 DIAGNOSIS — F17210 Nicotine dependence, cigarettes, uncomplicated: Secondary | ICD-10-CM | POA: Diagnosis not present

## 2016-03-03 DIAGNOSIS — K529 Noninfective gastroenteritis and colitis, unspecified: Secondary | ICD-10-CM | POA: Insufficient documentation

## 2016-03-03 DIAGNOSIS — F909 Attention-deficit hyperactivity disorder, unspecified type: Secondary | ICD-10-CM | POA: Insufficient documentation

## 2016-03-03 DIAGNOSIS — F419 Anxiety disorder, unspecified: Secondary | ICD-10-CM | POA: Diagnosis not present

## 2016-03-03 DIAGNOSIS — R1110 Vomiting, unspecified: Secondary | ICD-10-CM | POA: Insufficient documentation
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
## 2016-03-03 NOTE — ED Triage Notes (Signed)
Pt here for N,V, D for a few days. sts also that he wants an STD check denise symptoms.

## 2016-03-03 NOTE — ED Provider Notes (Signed)
[facility]    CSN: [id number] Arrival date & time: 03/03/16  1805     History   Chief Complaint Chief Complaint  Patient presents with  . Emesis  . Abdominal Cramping  . Diarrhea    HPI Clayton Leiternthony D Clinger Jr. is a 21 y.o. male.   C/o generalized abdominal pain and nausea and vomiting.  A few episodes of loose stool.  Denies blood in emesis or diarrhea.  Incidentally wants STD testing while here.  No penile discharge.      Past Medical History:  Diagnosis Date  . ADHD (attention deficit hyperactivity disorder)   . Anxiety   . Seasonal allergic rhinitis 2008   s/p allergy shots  . Smoker   . Unable to control anger     Patient Active Problem List   Diagnosis Date Noted  . MDD (major depressive disorder), single episode, moderate (HCC) 05/03/2015  . History of alcohol abuse 08/08/2014  . Intermittent explosive disorder 07/30/2014  . ADHD (attention deficit hyperactivity disorder), predominantly hyperactive impulsive type 07/30/2014  . Seasonal allergic rhinitis   . Smoker     History reviewed. No pertinent surgical history.     Home Medications    Prior to Admission medications   Medication Sig Start Date End Date Taking? Authorizing Provider  amitriptyline (ELAVIL) 50 MG tablet Take 1 tablet (50 mg total) by mouth 2 (two) times daily. For anxiety 05/20/15   Eustaquio BoydenJavier Burrows, MD  cetirizine (ZYRTEC) 10 MG tablet Take 10 mg by mouth daily.    Historical Provider, MD  mirtazapine (REMERON) 15 MG tablet TAKE 1 TABLET BY MOUTH AT  BEDTIME 10/17/15   Eustaquio BoydenJavier Seier, MD    Family History Family History  Problem Relation Age of Onset  . Asthma Father   . Hearing loss Father   . Hypertension Father   . Drug abuse Father   . Alcohol abuse Father   . Anxiety disorder Father   . Cancer Other     breast (paternal great aunt)  . Cancer Maternal Grandfather     kidney  . Cancer Paternal Grandmother     bone  . COPD Paternal Grandmother   .  Cancer Paternal Grandfather [id number]    colon  . Alcohol abuse Paternal Grandfather   . Bipolar disorder Sister   . Anxiety disorder Mother   . ADD / ADHD Brother   . CAD Neg Hx   . Stroke Neg Hx     Social History Social History  Substance Use Topics  . Smoking status: Current Every Day Smoker    Packs/day: 0.40    Types: Cigarettes  . Smokeless tobacco: Never Used  . Alcohol use 21.0 oz/week    20 Cans of beer, 15 Shots of liquor per week     Allergies   Bactrim [sulfamethoxazole-trimethoprim]   Review of Systems Review of Systems  Constitutional: Negative for chills and fever.  HENT: Negative for sore throat and tinnitus.   Eyes: Negative for redness.  Respiratory: Negative for cough and shortness of breath.   Cardiovascular: Negative for chest pain and palpitations.  Gastrointestinal: Positive for abdominal pain, diarrhea, nausea and vomiting.  Genitourinary: Negative for dysuria, frequency and urgency.  Musculoskeletal: Negative for myalgias.  Skin: Negative for rash.       No lesions  Neurological: Negative for weakness.  Hematological: Does not bruise/bleed easily.  Psychiatric/Behavioral: Negative for suicidal ideas.     Physical Exam Triage Vital Signs ED Triage Vitals [03/03/16 1909]  Enc Vitals Group     BP 128/82     Pulse Rate 89     Resp 18     Temp 98.2 F (36.8 C)     Temp src      SpO2 100 %     Weight      Height      Head Circumference      Peak Flow      Pain Score      Pain Loc      Pain Edu?      Excl. in GC?    No data found.   Updated Vital Signs BP 128/82   Pulse 89   Temp 98.2 F (36.8 C)   Resp 18   SpO2 100%   Visual Acuity Right Eye Distance:   Left Eye Distance:   Bilateral Distance:    Right Eye Near:   Left Eye Near:    Bilateral Near:     Physical Exam  Constitutional: He is oriented to person, place, and time. He appears well-developed and well-nourished. No distress.  HENT:  Head: Normocephalic and  atraumatic.  Mouth/Throat: Oropharynx is clear and moist.  Eyes: Conjunctivae and EOM are normal. Pupils are equal, round, and reactive to light. No scleral icterus.  Neck: Normal range of motion. Neck supple. No JVD present. No tracheal deviation present. No thyromegaly present.  Cardiovascular: Normal rate, regular rhythm and normal heart sounds.  Exam reveals no gallop and no friction rub.   No murmur heard. Pulmonary/Chest: Effort normal and breath sounds normal. No respiratory distress.  Abdominal: Soft. Bowel sounds are normal. He exhibits no distension. There is no tenderness.  Musculoskeletal: Normal range of motion. He exhibits no edema.  Lymphadenopathy:    He has no cervical adenopathy.  Neurological: He is alert and oriented to person, place, and time. No cranial nerve deficit.  Skin: Skin is warm and dry. No rash noted. No erythema.  Psychiatric: He has a normal mood and affect. His behavior is normal. Judgment and thought content normal.     UC Treatments / Results  Labs (all labs ordered are listed, but only abnormal results are displayed) Labs Reviewed  RPR  HIV ANTIBODY (ROUTINE TESTING)  URINE CYTOLOGY ANCILLARY ONLY    EKG  EKG Interpretation None       Radiology No results found.  Procedures Procedures (including critical care time)  Medications Ordered in UC Medications - No data to display   Initial Impression / Assessment and Plan / UC Course  I have reviewed the triage vital signs and the nursing notes.  Pertinent labs & imaging results that were available during my care of the patient were reviewed by me and considered in my medical decision making (see chart for details).     Likely viral gastroenteritis.  F/u STD testing.  Instructed to go to the ED if unable to hold down fluids x36 hours  Final Clinical Impressions(s) / UC Diagnoses   Final diagnoses:  Gastroenteritis    New Prescriptions Discharge Medication List as of 03/03/2016   8:06 PM       Arnaldo Natal, MD 03/03/16 2105

## 2016-03-04 LAB — URINE CYTOLOGY ANCILLARY ONLY
Chlamydia: NEGATIVE
Neisseria Gonorrhea: NEGATIVE

## 2016-03-05 LAB — HIV ANTIBODY (ROUTINE TESTING W REFLEX): HIV Screen 4th Generation wRfx: NONREACTIVE

## 2016-03-05 LAB — RPR: RPR Ser Ql: NONREACTIVE

## 2016-03-30 ENCOUNTER — Encounter (HOSPITAL_COMMUNITY): Payer: Self-pay | Admitting: Emergency Medicine

## 2016-03-30 ENCOUNTER — Emergency Department (HOSPITAL_COMMUNITY)
Admission: EM | Admit: 2016-03-30 | Discharge: 2016-03-30 | Disposition: A | Discharge status: Home / Self Care | Payer: 59 | Attending: Emergency Medicine | Admitting: Emergency Medicine

## 2016-03-30 ENCOUNTER — Other Ambulatory Visit: Payer: Self-pay | Admitting: Family Medicine

## 2016-03-30 DIAGNOSIS — F909 Attention-deficit hyperactivity disorder, unspecified type: Secondary | ICD-10-CM | POA: Insufficient documentation

## 2016-03-30 DIAGNOSIS — W01198A Fall on same level from slipping, tripping and stumbling with subsequent striking against other object, initial encounter: Secondary | ICD-10-CM | POA: Diagnosis not present

## 2016-03-30 DIAGNOSIS — S0990XA Unspecified injury of head, initial encounter: Secondary | ICD-10-CM | POA: Diagnosis present

## 2016-03-30 DIAGNOSIS — Y999 Unspecified external cause status: Secondary | ICD-10-CM | POA: Diagnosis not present

## 2016-03-30 DIAGNOSIS — Y929 Unspecified place or not applicable: Secondary | ICD-10-CM | POA: Insufficient documentation

## 2016-03-30 DIAGNOSIS — Z79899 Other long term (current) drug therapy: Secondary | ICD-10-CM | POA: Insufficient documentation

## 2016-03-30 DIAGNOSIS — F17210 Nicotine dependence, cigarettes, uncomplicated: Secondary | ICD-10-CM | POA: Insufficient documentation

## 2016-03-30 DIAGNOSIS — S060X0A Concussion without loss of consciousness, initial encounter: Secondary | ICD-10-CM | POA: Diagnosis not present

## 2016-03-30 DIAGNOSIS — Y939 Activity, unspecified: Secondary | ICD-10-CM | POA: Diagnosis not present

## 2016-03-30 MED ORDER — OXYCODONE-ACETAMINOPHEN 5-325 MG PO TABS
1.0000 | ORAL_TABLET | Freq: Once | ORAL | Status: AC
  Administered 2016-03-30: 1 via ORAL
  Filled 2016-03-30 (×2): qty 1
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
## 2016-03-31 NOTE — Telephone Encounter (Signed)
Ok to refill? Last filled 10/17/15 #90 1RF

## 2016-04-08 ENCOUNTER — Encounter: Payer: Self-pay | Admitting: Family Medicine

## 2016-04-08 ENCOUNTER — Encounter: Payer: Self-pay | Admitting: Emergency Medicine

## 2016-04-08 ENCOUNTER — Ambulatory Visit (INDEPENDENT_AMBULATORY_CARE_PROVIDER_SITE_OTHER): Payer: 59 | Admitting: Family Medicine

## 2016-04-08 VITALS — Temp 98.5°F | Wt 155.0 lb

## 2016-04-08 DIAGNOSIS — S060X0D Concussion without loss of consciousness, subsequent encounter: Secondary | ICD-10-CM | POA: Diagnosis not present

## 2016-04-08 NOTE — Progress Notes (Signed)
Pre visit review using our clinic review tool, if applicable. No additional management support is needed unless otherwise documented below in the visit note. 

## 2016-04-08 NOTE — Progress Notes (Signed)
Subjective:    Patient ID: Clayton LeiterAnthony D Rufer Jr., male    DOB: 12/12/1995, 21 y.o.   MRN: [id number]  HPI This is a 21 yo male who presents today for follow up of ER visit from 03/30/2016. He was diagnosed with concussion without loss of consciousness. He denies headaches, dizziness, lightheadedness, visual changes, nausea or vomiting. Has not been drinking alcohol. Feels well. 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 He enjoys his work.  Smokes about 1/2 ppd. Trying to cut back.   Past Medical History:  Diagnosis Date  . ADHD (attention deficit hyperactivity disorder)   . Anxiety   . Seasonal allergic rhinitis 2008   s/p allergy shots  . Smoker   . Unable to control anger    No past surgical history on file. Family History  Problem Relation Age of Onset  . Asthma Father   . Hearing loss Father   . Hypertension Father   . Drug abuse Father   . Alcohol abuse Father   . Anxiety disorder Father   . Cancer Other     breast (paternal great aunt)  . Cancer Maternal Grandfather     kidney  . Cancer Paternal Grandmother     bone  . COPD Paternal Grandmother   . Cancer Paternal Grandfather [id number]    colon  . Alcohol abuse Paternal Grandfather   . Bipolar disorder Sister   . Anxiety disorder Mother   . ADD / ADHD Brother   . CAD Neg Hx   . Stroke Neg Hx    Social History  Substance Use Topics  . Smoking status: Current Every Day Smoker    Packs/day: 0.40    Types: Cigarettes  . Smokeless tobacco: Never Used  . Alcohol use 21.0 oz/week    20 Cans of beer, 15 Shots of liquor per week      Review of Systems  Constitutional: Negative for fatigue.  Eyes: Negative for visual disturbance.  Respiratory: Negative for shortness of breath.   Cardiovascular: Negative for chest pain.  Musculoskeletal: Negative for arthralgias, myalgias and neck pain.  Neurological: Negative for dizziness, weakness, light-headedness and headaches.       Objective:   Physical Exam  Constitutional: He is oriented to person, place, and time. He appears well-developed and well-nourished. No distress.  HENT:  Head: Normocephalic and atraumatic.  Eyes: Conjunctivae and EOM are normal. Pupils are equal, round, and reactive to light. Right eye exhibits no discharge. Left eye exhibits no discharge.  Neck: Normal range of motion. Neck supple.  Cardiovascular: Normal rate, regular rhythm and normal heart sounds.   Pulmonary/Chest: Effort normal and breath sounds normal.  Musculoskeletal: Normal range of motion. He exhibits no edema.  Neurological: He is alert and oriented to person, place, and time.  Cranial nerves: CN I: not tested CN II: pupils equal, round and reactive to light. CN III, IV, VI: full range of motion, no nystagmus, no ptosis CN V: facial sensation intact CN VII: upper and lower face symmetric CN VIII: hearing intact CN IX, X: gag intact, uvula midline CN XI: sternocleidomastoid and trapezius muscles intact CN XII: tongue midline Bulk & Tone: normal, no fasciculations. Motor: 5/5 throughout. Deep Tendon Reflexes: 2+ throughout.  Finger to nose testing: Without dysmetria.  Gait: Normal station and stride.  Skin: Skin is warm and dry. He is not diaphoretic.  Bilateral palms with healing abrasions.   Psychiatric: He has a normal mood and affect.  His behavior is normal. Judgment and thought content normal.  Vitals reviewed.     Temp 98.5 F (36.9 C) (Oral)   Wt 155 lb (70.3 kg)   SpO2 100%   BMI 22.89 kg/m  BP 118/80     Assessment & Plan:  1. Concussion without loss of consciousness, subsequent encounter - no abnormal history or findings on exam - letter provided to return to work   Olean Ree, FNP-BC   [facility] [place], [facility]  04/08/2016 5:21 PM

## 2016-04-17 ENCOUNTER — Ambulatory Visit (INDEPENDENT_AMBULATORY_CARE_PROVIDER_SITE_OTHER): Payer: 59 | Admitting: Family Medicine

## 2016-04-17 ENCOUNTER — Ambulatory Visit (INDEPENDENT_AMBULATORY_CARE_PROVIDER_SITE_OTHER)
Admission: RE | Admit: 2016-04-17 | Discharge: 2016-04-17 | Disposition: A | Discharge status: Home / Self Care | Payer: 59 | Source: Ambulatory Visit | Attending: Family Medicine | Admitting: Family Medicine

## 2016-04-17 ENCOUNTER — Encounter: Payer: Self-pay | Admitting: Family Medicine

## 2016-04-17 VITALS — BP 104/66 | HR 86 | Temp 97.6°F | Wt 155.0 lb

## 2016-04-17 DIAGNOSIS — M79672 Pain in left foot: Secondary | ICD-10-CM | POA: Diagnosis not present

## 2016-04-17 DIAGNOSIS — F17200 Nicotine dependence, unspecified, uncomplicated: Secondary | ICD-10-CM

## 2016-04-17 MED ORDER — AMITRIPTYLINE HCL 50 MG PO TABS: 50.0000 mg | ORAL_TABLET | Freq: Two times a day (BID) | ORAL | 1 refills | Status: AC
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
## 2016-04-17 NOTE — Patient Instructions (Signed)
I think you had bony contusion of heel. Use ASO ankle brace for further support for next few weeks. Continue tylenol or ibuprofen for discomfort. If not better, we will refer you to sports medicine doctor.

## 2016-04-17 NOTE — Progress Notes (Signed)
Pre visit review using our clinic review tool, if applicable. No additional management support is needed unless otherwise documented below in the visit note. 

## 2016-04-17 NOTE — Assessment & Plan Note (Signed)
Exam today concerning for calcaneal fracture - but xrays clear on my read. ?bony contusion - rec treat with NSAID, ASO brace for support, ice to area.  Update if not improving with treatment for SM referral and further evaluation. Pt agrees with plan.

## 2016-04-17 NOTE — Assessment & Plan Note (Addendum)
Continue to encourage cessation. Slowly cutting down to <1/2 ppd

## 2016-06-16 ENCOUNTER — Ambulatory Visit (INDEPENDENT_AMBULATORY_CARE_PROVIDER_SITE_OTHER): Payer: 59 | Admitting: Family Medicine

## 2016-06-16 ENCOUNTER — Encounter: Payer: Self-pay | Admitting: Family Medicine

## 2016-06-16 VITALS — BP 120/82 | Temp 98.7°F | Ht 68.5 in | Wt 150.5 lb

## 2016-06-16 DIAGNOSIS — F17200 Nicotine dependence, unspecified, uncomplicated: Secondary | ICD-10-CM

## 2016-06-16 DIAGNOSIS — S161XXA Strain of muscle, fascia and tendon at neck level, initial encounter: Secondary | ICD-10-CM | POA: Diagnosis not present

## 2016-06-16 DIAGNOSIS — Z205 Contact with and (suspected) exposure to viral hepatitis: Secondary | ICD-10-CM

## 2016-06-16 DIAGNOSIS — Z113 Encounter for screening for infections with a predominantly sexual mode of transmission: Secondary | ICD-10-CM

## 2016-06-16 DIAGNOSIS — Z7251 High risk heterosexual behavior: Secondary | ICD-10-CM

## 2016-06-16 NOTE — Patient Instructions (Signed)
STD screen today with labs and blood work. For neck - continue gentle stretching, ibuprofen as needed, heating pad. Should improve with time.

## 2016-06-16 NOTE — Assessment & Plan Note (Signed)
Continue to encourage cessation. 

## 2016-06-16 NOTE — Assessment & Plan Note (Signed)
Discussed safe sex practices. STD screen today Acute hep panel today given Hep C exposure.
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
## 2016-06-16 NOTE — Assessment & Plan Note (Signed)
Benign exam. Anticipate cervical strain. Treat with heating pad, ibuprofen, stretches provided today. Update if not improving with treatment.

## 2016-06-17 LAB — GC/CHLAMYDIA PROBE AMP
CT PROBE, AMP APTIMA: NOT DETECTED
GC PROBE AMP APTIMA: NOT DETECTED

## 2016-06-17 LAB — HIV ANTIBODY (ROUTINE TESTING W REFLEX): HIV: NONREACTIVE

## 2016-06-17 LAB — HEPATITIS PANEL, ACUTE
HCV AB: NEGATIVE
Hep A IgM: NONREACTIVE
Hep B C IgM: NONREACTIVE
Hepatitis B Surface Ag: NEGATIVE

## 2016-06-17 LAB — RPR

## 2016-06-21 ENCOUNTER — Other Ambulatory Visit: Payer: Self-pay | Admitting: Family Medicine

## 2016-06-22 NOTE — Telephone Encounter (Signed)
Ok to refill? No CPE. Rx last filled 04/02/16 #90 w/ 0 refills.

## 2016-06-24 ENCOUNTER — Telehealth: Payer: Self-pay | Admitting: Family Medicine

## 2016-06-24 NOTE — Telephone Encounter (Signed)
Pt called to get lab results. Best [phone] until 5pm today, if after 5pm call [phone].

## 2016-06-24 NOTE — Telephone Encounter (Signed)
See lab results. Tried to call pt, again. Left message.

## 2016-06-25 ENCOUNTER — Telehealth: Payer: Self-pay | Admitting: Family Medicine

## 2016-06-25 NOTE — Telephone Encounter (Signed)
Pt returned call for labs. Please call house number at [phone] thanks

## 2016-06-25 NOTE — Telephone Encounter (Signed)
Called the # listed in this phone note and person who answered said I have the wrong #, called # on file and no answer and a message says this person can't accept phone calls

## 2017-11-05 IMAGING — DX DG OS CALCIS 2+V*L*
2 series · 2 of 2 positions shown · non-contrast
Comparison: None.

CLINICAL DATA: Left heel pain status post fall 03/30/2016

EXAM:
LEFT OS CALCIS - 2+ VIEW

[calcaneus axial]
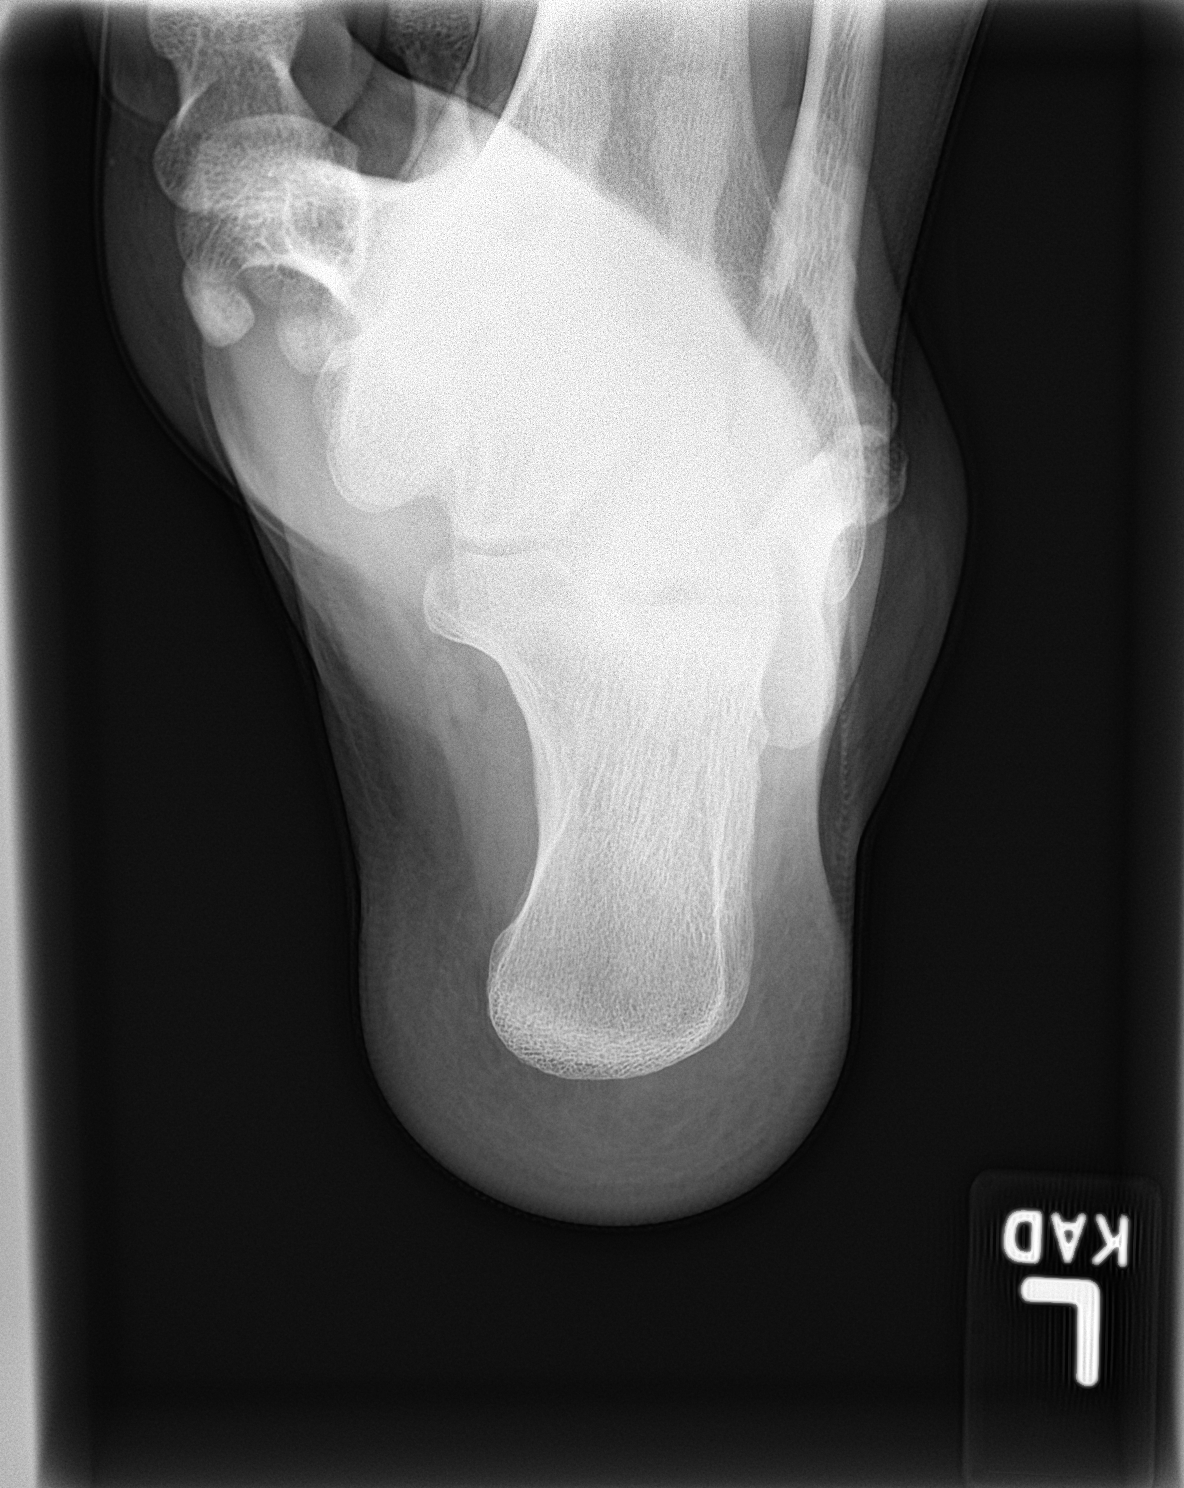

[calcaneus lat]
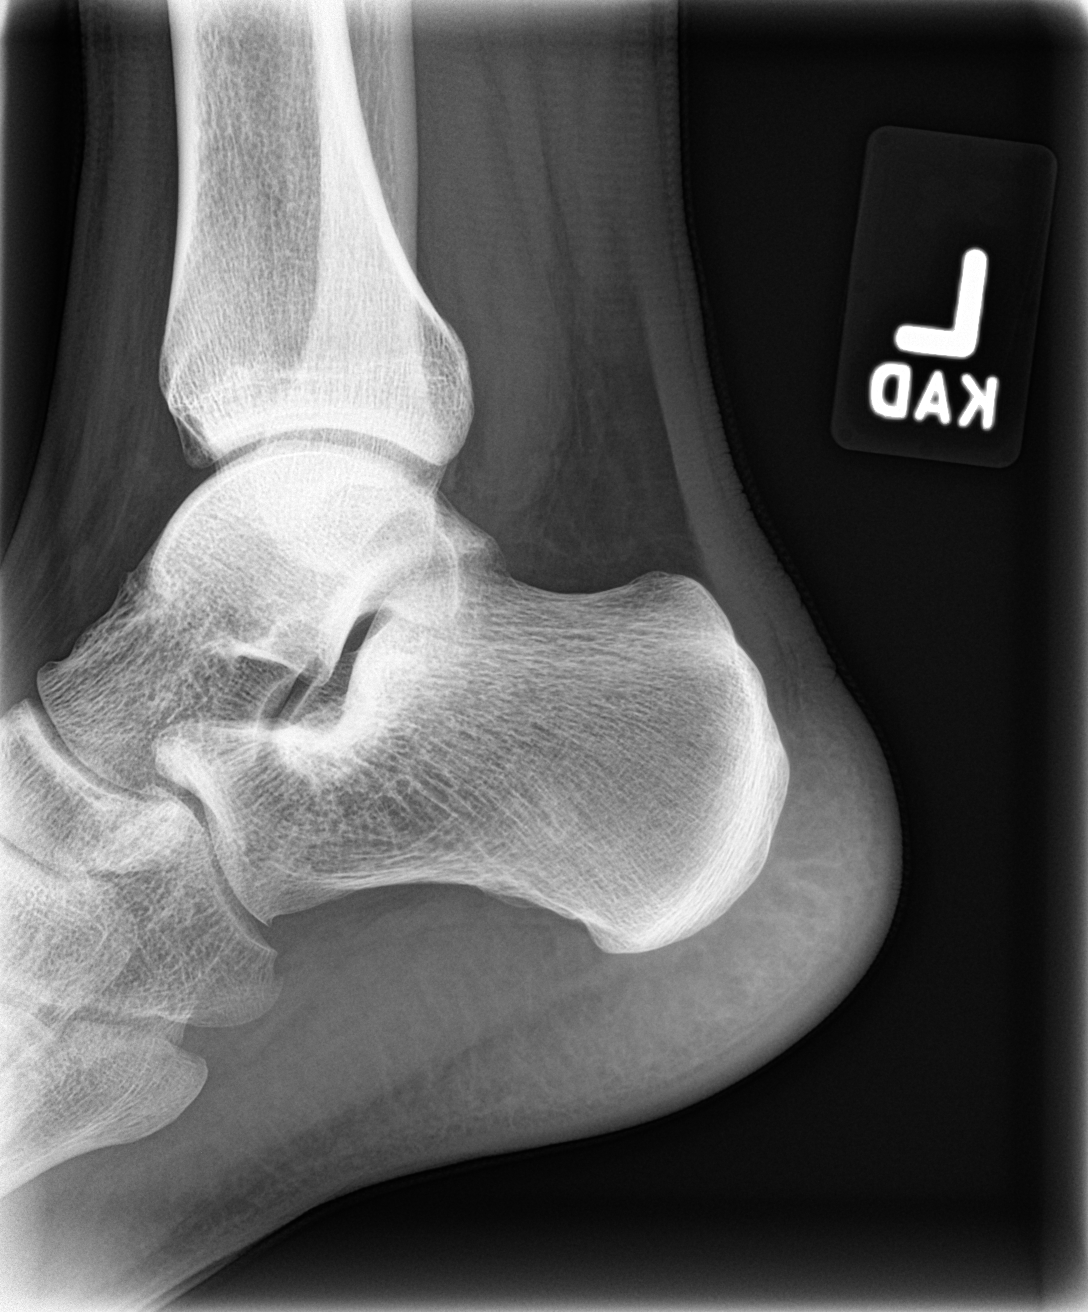

[2 of 2 positions shown; findings below may reference images not displayed]

FINDINGS: There is no evidence of fracture or other focal bone lesions. Soft
tissues are unremarkable.
IMPRESSION: Negative.

## 2019-01-13 ENCOUNTER — Ambulatory Visit (HOSPITAL_COMMUNITY)
Admission: EM | Admit: 2019-01-13 | Discharge: 2019-01-13 | Disposition: A | Discharge status: Home / Self Care | Payer: Managed Care, Other (non HMO) | Attending: Emergency Medicine | Admitting: Emergency Medicine

## 2019-01-13 ENCOUNTER — Other Ambulatory Visit: Payer: Self-pay

## 2019-01-13 ENCOUNTER — Encounter (HOSPITAL_COMMUNITY): Payer: Self-pay

## 2019-01-13 DIAGNOSIS — Z881 Allergy status to other antibiotic agents status: Secondary | ICD-10-CM | POA: Insufficient documentation

## 2019-01-13 DIAGNOSIS — J029 Acute pharyngitis, unspecified: Secondary | ICD-10-CM | POA: Diagnosis present

## 2019-01-13 DIAGNOSIS — F17210 Nicotine dependence, cigarettes, uncomplicated: Secondary | ICD-10-CM | POA: Insufficient documentation

## 2019-01-13 DIAGNOSIS — Z20822 Contact with and (suspected) exposure to covid-19: Secondary | ICD-10-CM | POA: Insufficient documentation

## 2019-01-13 DIAGNOSIS — Z79899 Other long term (current) drug therapy: Secondary | ICD-10-CM | POA: Insufficient documentation

## 2019-01-13 LAB — POCT RAPID STREP A: Streptococcus, Group A Screen (Direct): NEGATIVE

## 2019-01-13 NOTE — ED Triage Notes (Signed)
Pt present sore throat, symptom started yesterday evening. Pt has notice some white spots on the back of his throat and he has not been able to eat or drink any thing.
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
## 2019-01-13 NOTE — Discharge Instructions (Signed)
Self isolate until covid results are back and negative.  Will notify you by phone of any positive findings. Your negative results will be sent through your MyChart.    Negative rapid strep here today.  Push fluids to ensure adequate hydration and keep secretions thin.  Tylenol and/or ibuprofen as needed for pain or fevers.  Throat lozenges, gargles, chloraseptic spray, warm teas, popsicles etc to help with throat pain.   If symptoms worsen or do not improve in the next week to return to be seen or to follow up with PCP.

## 2019-01-15 LAB — CULTURE, GROUP A STREP (THRC)

## 2019-01-15 LAB — NOVEL CORONAVIRUS, NAA (HOSP ORDER, SEND-OUT TO REF LAB; TAT 18-24 HRS): SARS-CoV-2, NAA: NOT DETECTED

## 2019-08-15 ENCOUNTER — Telehealth: Payer: Self-pay | Admitting: Family Medicine

## 2019-08-15 NOTE — Telephone Encounter (Signed)
Pt called to schedule appointment to discuss vaccines.  Last appointment 06/16/16  Is it ok to re-establish.  Pt also stating he is needing a refill abilify  Pt is out of his meds  [facility] [address]

## 2019-08-15 NOTE — Telephone Encounter (Signed)
Ok to re establish in open 30 min slot. I don't have him on abilify. rec OV prior to refills.

## 2019-08-17 NOTE — Telephone Encounter (Signed)
Appointment 8/12  Pt stated he has been out of his meds for a month

## 2019-08-24 ENCOUNTER — Ambulatory Visit: Payer: Managed Care, Other (non HMO) | Admitting: Family Medicine

## 2019-08-24 DIAGNOSIS — Z0289 Encounter for other administrative examinations: Secondary | ICD-10-CM

## 2022-06-05 ENCOUNTER — Other Ambulatory Visit: Payer: Self-pay

## 2022-06-05 ENCOUNTER — Emergency Department (HOSPITAL_COMMUNITY): Payer: No Typology Code available for payment source

## 2022-06-05 ENCOUNTER — Emergency Department (HOSPITAL_COMMUNITY)
Admission: EM | Admit: 2022-06-05 | Discharge: 2022-06-05 | Disposition: A | Discharge status: Home / Self Care | Payer: No Typology Code available for payment source | Attending: Emergency Medicine | Admitting: Emergency Medicine

## 2022-06-05 DIAGNOSIS — Y990 Civilian activity done for income or pay: Secondary | ICD-10-CM | POA: Diagnosis not present

## 2022-06-05 DIAGNOSIS — W230XXA Caught, crushed, jammed, or pinched between moving objects, initial encounter: Secondary | ICD-10-CM | POA: Insufficient documentation

## 2022-06-05 DIAGNOSIS — Z87891 Personal history of nicotine dependence: Secondary | ICD-10-CM | POA: Insufficient documentation

## 2022-06-05 DIAGNOSIS — S68111A Complete traumatic metacarpophalangeal amputation of left index finger, initial encounter: Secondary | ICD-10-CM | POA: Diagnosis not present

## 2022-06-05 DIAGNOSIS — S68119A Complete traumatic metacarpophalangeal amputation of unspecified finger, initial encounter: Secondary | ICD-10-CM

## 2022-06-05 DIAGNOSIS — S6992XA Unspecified injury of left wrist, hand and finger(s), initial encounter: Secondary | ICD-10-CM | POA: Diagnosis present

## 2022-06-05 DIAGNOSIS — T3695XA Adverse effect of unspecified systemic antibiotic, initial encounter: Secondary | ICD-10-CM | POA: Insufficient documentation

## 2022-06-05 MED ORDER — IBUPROFEN 800 MG PO TABS
800.0000 mg | ORAL_TABLET | Freq: Once | ORAL | Status: AC
  Administered 2022-06-05: 800 mg via ORAL
  Filled 2022-06-05: qty 1

## 2022-06-05 MED ORDER — IBUPROFEN 600 MG PO TABS: 600.0000 mg | ORAL_TABLET | Freq: Three times a day (TID) | ORAL | 0 refills | Status: AC | PRN

## 2022-06-05 MED ORDER — CLINDAMYCIN HCL 150 MG PO CAPS: 150.0000 mg | ORAL_CAPSULE | Freq: Three times a day (TID) | ORAL | 0 refills | Status: AC

## 2022-06-05 MED ORDER — ACETAMINOPHEN 325 MG PO TABS
650.0000 mg | ORAL_TABLET | Freq: Once | ORAL | Status: AC
  Administered 2022-06-05: 650 mg via ORAL
  Filled 2022-06-05: qty 2

## 2022-06-05 MED ORDER — DIPHENHYDRAMINE HCL 50 MG/ML IJ SOLN
25.0000 mg | Freq: Once | INTRAMUSCULAR | Status: AC
  Administered 2022-06-05: 25 mg via INTRAVENOUS
  Filled 2022-06-05: qty 1

## 2022-06-05 MED ORDER — CEFAZOLIN SODIUM-DEXTROSE 2-4 GM/100ML-% IV SOLN
2.0000 g | Freq: Once | INTRAVENOUS | Status: AC
  Administered 2022-06-05: 2 g via INTRAVENOUS
  Filled 2022-06-05: qty 100

## 2022-06-05 NOTE — Consult Note (Signed)
Reason for Consult:Left index finger amputation Referring Physician: Linwood Dibbles Time called: 1142 Time at bedside: 1200   Clayton Winkles. is an 27 y.o. male.  HPI: Clayton Mason got his left index finger caught in a clamp today at work and it severed the tip. He was brought to the ED and hand surgery was consulted. 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  Past Medical History:  Diagnosis Date   ADHD (attention deficit hyperactivity disorder)    Anxiety    Seasonal allergic rhinitis 2008   s/p allergy shots   Smoker    Unable to control anger     No past surgical history on file.  Family History  Problem Relation Age of Onset   Asthma Father    Hearing loss Father    Hypertension Father    Drug abuse Father    Alcohol abuse Father    Anxiety disorder Father    Cancer Other        breast (paternal great aunt)   Cancer Maternal Grandfather        kidney   Cancer Paternal Grandmother        bone   COPD Paternal Grandmother    Cancer Paternal Grandfather 67       colon   Alcohol abuse Paternal Grandfather    Bipolar disorder Sister    Anxiety disorder Mother    ADD / ADHD Brother    CAD Neg Hx    Stroke Neg Hx     Social History:  reports that he has been smoking cigarettes. He has been smoking an average of .4 packs per day. He has never used smokeless tobacco. He reports current alcohol use of about 35.0 standard drinks of alcohol per week. He reports that he does not use drugs.  Allergies:  Allergies  Allergen Reactions   Bactrim [Sulfamethoxazole-Trimethoprim] Nausea And Vomiting    Medications: I have reviewed the patient's current medications.  No results found for this or any previous visit (from the past 48 hour(s)).  DG Hand 2 View Left  Result Date: 06/05/2022 CLINICAL DATA:  Finger injury.  Amputation. EXAM: LEFT HAND - 2 VIEW COMPARISON:  None Available. FINDINGS: Two views study shows amputation at the tip of the index finger with loss of bony anatomy on the  distal tuft of the distal phalanx. No evidence for radiopaque soft tissue foreign body. No other acute bony abnormality. IMPRESSION: Amputation at the tip of the index finger with loss of bony anatomy along the tuft of the distal phalanx. Electronically Signed   By: Kennith Center M.D.   On: 06/05/2022 11:40    Review of Systems  HENT:  Negative for ear discharge, ear pain, hearing loss and tinnitus.   Eyes:  Negative for photophobia and pain.  Respiratory:  Negative for cough and shortness of breath.   Cardiovascular:  Negative for chest pain.  Gastrointestinal:  Negative for abdominal pain, nausea and vomiting.  Genitourinary:  Negative for dysuria, flank pain, frequency and urgency.  Musculoskeletal:  Positive for arthralgias (Left index finger). Negative for back pain, myalgias and neck pain.  Neurological:  Negative for dizziness and headaches.  Hematological:  Does not bruise/bleed easily.  Psychiatric/Behavioral:  The patient is not nervous/anxious.    Blood pressure 125/84, pulse 66, temperature 98.2 F (36.8 C), temperature source Oral, resp. rate 17, height 5\' 9"  (1.753 m), weight 70.3 kg, SpO2 99 %. Physical Exam Constitutional:      General: He is not  in acute distress.    Appearance: He is well-developed. He is not diaphoretic.  HENT:     Head: Normocephalic and atraumatic.  Eyes:     General: No scleral icterus.       Right eye: No discharge.        Left eye: No discharge.     Conjunctiva/sclera: Conjunctivae normal.  Cardiovascular:     Rate and Rhythm: Normal rate and regular rhythm.  Pulmonary:     Effort: Pulmonary effort is normal. No respiratory distress.  Musculoskeletal:     Cervical back: Normal range of motion.     Comments: Left shoulder, elbow, wrist, digits- Tip amputation through nail, mod TTP, no instability, no blocks to motion  Sens  Ax/R/M/U intact  Mot   Ax/ R/ PIN/ M/ AIN/ U intact  Rad 2+  Skin:    General: Skin is warm and dry.  Neurological:      Mental Status: He is alert.  Psychiatric:        Mood and Affect: Mood normal.        Behavior: Behavior normal.     Assessment/Plan: Left index finger amputation -- Xeroform dressing, d/c on Keflex and f/u with Dr. Janee Morn Tuesday.    Freeman Caldron, PA-C Orthopedic Surgery [phone] 06/05/2022, 12:06 PM

## 2022-06-05 NOTE — Discharge Instructions (Signed)
Continue to keep the wound covered as needed today.  Follow-up with Dr. Janee Morn for further evaluation.

## 2022-06-05 NOTE — ED Provider Notes (Signed)
[place] EMERGENCY DEPARTMENT AT [facility] Provider Note   CSN: [id number] Arrival date & time: 06/05/22  1049     History  Chief Complaint  Patient presents with   Finger Injury    Clayton Wittstruck. is a 27 y.o. male.  HPI   Patient presents to the ED for evaluation after a finger injury.  Patient was at work today when he excellently caught his fingertip between 2 heavy metal objects.  Patient had a portion of his finger amputated.  Patient denies any other injuries.  Tetanus shot was 1 year ago  Home Medications Prior to Admission medications   Medication Sig Start Date End Date Taking? Authorizing Provider  cetirizine (ZYRTEC) 10 MG tablet Take 10 mg by mouth daily.   Yes [provider]  clindamycin (CLEOCIN) 150 MG capsule Take 1 capsule (150 mg total) by mouth 3 (three) times daily for 5 days. 06/05/22 06/10/22 Yes Linwood Dibbles, MD  ibuprofen (ADVIL) 600 MG tablet Take 1 tablet (600 mg total) by mouth every 8 (eight) hours as needed. 06/05/22  Yes Linwood Dibbles, MD  amitriptyline (ELAVIL) 50 MG tablet Take 1 tablet (50 mg total) by mouth 2 (two) times daily. For anxiety Patient not taking: Reported on 06/05/2022 04/17/16   Eustaquio Boyden, MD  mirtazapine (REMERON) 15 MG tablet TAKE 1 TABLET BY MOUTH AT  BEDTIME Patient not taking: Reported on 06/05/2022 06/23/16   Eustaquio Boyden, MD      Allergies    Ancef [cefazolin] and Bactrim [sulfamethoxazole-trimethoprim]    Review of Systems   Review of Systems  Physical Exam Updated Vital Signs BP 125/84 (BP Location: Right Arm)   Pulse 66   Temp 98.2 F (36.8 C) (Oral)   Resp 17   Ht 1.753 m (5\' 9" )   Wt 70.3 kg   SpO2 99%   BMI 22.89 kg/m  Physical Exam Vitals and nursing note reviewed.  Constitutional:      General: He is not in acute distress.    Appearance: He is well-developed.  HENT:     Head: Normocephalic and atraumatic.     Right Ear: External ear normal.     Left Ear: External  ear normal.  Eyes:     General: No scleral icterus.       Right eye: No discharge.        Left eye: No discharge.     Conjunctiva/sclera: Conjunctivae normal.  Neck:     Trachea: No tracheal deviation.  Cardiovascular:     Rate and Rhythm: Normal rate.  Pulmonary:     Effort: Pulmonary effort is normal. No respiratory distress.     Breath sounds: No stridor.  Abdominal:     General: There is no distension.  Musculoskeletal:        General: No swelling or deformity.     Cervical back: Neck supple.     Comments: Amputation of the distal aspect of his fingertip, the nail is involved  Skin:    General: Skin is warm and dry.     Findings: No rash.  Neurological:     Mental Status: He is alert. Mental status is at baseline.     Cranial Nerves: No dysarthria or facial asymmetry.     Motor: No seizure activity.     ED Results / Procedures / Treatments   Labs (all labs ordered are listed, but only abnormal results are displayed) Labs Reviewed - No data to display  EKG None  Radiology DG Hand 2 View Left  Result Date: 06/05/2022 CLINICAL DATA:  Finger injury.  Amputation. EXAM: LEFT HAND - 2 VIEW COMPARISON:  None Available. FINDINGS: Two views study shows amputation at the tip of the index finger with loss of bony anatomy on the distal tuft of the distal phalanx. No evidence for radiopaque soft tissue foreign body. No other acute bony abnormality. IMPRESSION: Amputation at the tip of the index finger with loss of bony anatomy along the tuft of the distal phalanx. Electronically Signed   By: Kennith Center M.D.   On: 06/05/2022 11:40    Procedures Procedures    Medications Ordered in ED Medications  ceFAZolin (ANCEF) IVPB 2g/100 mL premix (0 g Intravenous Stopped 06/05/22 1325)  acetaminophen (TYLENOL) tablet 650 mg (650 mg Oral Given 06/05/22 1247)  ibuprofen (ADVIL) tablet 800 mg (800 mg Oral Given 06/05/22 1247)  diphenhydrAMINE (BENADRYL) injection 25 mg (25 mg Intravenous  Given 06/05/22 1325)    ED Course/ Medical Decision Making/ A&P Clinical Course as of 06/05/22 1433  Fri Jun 05, 2022  1145 I reviewed the case with orthopedics, Earney Hamburg.  He will see the patient in the ED [JK]  1320 Patient has developed some hives after getting his Ancef infusion that was ordered by the orthopedic team.  Patient did not have a known Ancef allergy.  Will give a dose of Benadryl and monitor [JK]  1431 Patient no longer has any urticaria.  No breathing difficulty. [JK]    Clinical Course User Index [JK] Linwood Dibbles, MD                             Medical Decision Making Problems Addressed: Allergic reaction due to antibacterial drug: acute illness or injury that poses a threat to life or bodily functions Fingertip amputation, initial encounter: acute illness or injury that poses a threat to life or bodily functions  Amount and/or Complexity of Data Reviewed Radiology: ordered and independent interpretation performed.  Risk Prescription drug management.   Patient presented to the ED for evaluation after finger injury.  Patient had partial amputation of his fingertip.  Orthopedics evaluated the patient.  No indication for operative repair at this time.  Plan will be Xeroform dressings and close outpatient follow-up with orthopedics.  Patient given a dose of antibiotics as recommended by orthopedics for his finger injury.  Patient did experience hives.  He was given a dose of Benadryl and the symptoms resolved.  Will avoid cephalosporin antibiotics with him in the future.  Outpatient follow-up with orthopedics and a prescription for clindamycin for him.        Final Clinical Impression(s) / ED Diagnoses Final diagnoses:  Fingertip amputation, initial encounter  Allergic reaction due to antibacterial drug    Rx / DC Orders ED Discharge Orders          Ordered    clindamycin (CLEOCIN) 150 MG capsule  3 times daily        06/05/22 1401    ibuprofen  (ADVIL) 600 MG tablet  Every 8 hours PRN        06/05/22 1402              Linwood Dibbles, MD 06/05/22 1433

## 2022-06-05 NOTE — ED Triage Notes (Signed)
Clayton Mason. is a 27 y.o. male at work reports left pointer finger got stuck in clamp and ripped tip of finger off just below nail pt denies  any hx or meds last tetanus year ago

## 2023-03-15 ENCOUNTER — Ambulatory Visit: Payer: Self-pay | Admitting: Family Medicine

## 2023-03-15 NOTE — Progress Notes (Deleted)
 New Patient Office Visit  Subjective    Patient ID: Clayton Mihalik., male    DOB: [birth year]  Age: 28 y.o. MRN: [id number]  CC: No chief complaint on file.   HPI Clayton Mason. presents to establish care ***  Outpatient Encounter Medications as of 03/15/2023  Medication Sig  . amitriptyline (ELAVIL) 50 MG tablet Take 1 tablet (50 mg total) by mouth 2 (two) times daily. For anxiety (Patient not taking: Reported on 06/05/2022)  . cetirizine (ZYRTEC) 10 MG tablet Take 10 mg by mouth daily.  Marland Kitchen ibuprofen (ADVIL) 600 MG tablet Take 1 tablet (600 mg total) by mouth every 8 (eight) hours as needed.  . mirtazapine (REMERON) 15 MG tablet TAKE 1 TABLET BY MOUTH AT  BEDTIME (Patient not taking: Reported on 06/05/2022)   No facility-administered encounter medications on file as of 03/15/2023.    Past Medical History:  Diagnosis Date  . ADHD (attention deficit hyperactivity disorder)   . Anxiety   . Seasonal allergic rhinitis 2008   s/p allergy shots  . Smoker   . Unable to control anger     No past surgical history on file.  Family History  Problem Relation Age of Onset  . Asthma Father   . Hearing loss Father   . Hypertension Father   . Drug abuse Father   . Alcohol abuse Father   . Anxiety disorder Father   . Cancer Other        breast (paternal great aunt)  . Cancer Maternal Grandfather        kidney  . Cancer Paternal Grandmother        bone  . COPD Paternal Grandmother   . Cancer Paternal Grandfather 73       colon  . Alcohol abuse Paternal Grandfather   . Bipolar disorder Sister   . Anxiety disorder Mother   . ADD / ADHD Brother   . CAD Neg Hx   . Stroke Neg Hx     Social History   Socioeconomic History  . Marital status: Single    Spouse name: Not on file  . Number of children: Not on file  . Years of education: Not on file  . Highest education level: Not on file  Occupational History  . Not on file  Tobacco Use  . Smoking status: Every Day     Current packs/day: 0.40    Types: Cigarettes  . Smokeless tobacco: Never  Substance and Sexual Activity  . Alcohol use: Yes    Alcohol/week: 35.0 standard drinks of alcohol    Types: 20 Cans of beer, 15 Shots of liquor per week  . Drug use: No    Types: Marijuana    Comment: occasional MJ  . Sexual activity: Not on file  Other Topics Concern  . 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  Physical Exam Vitals and nursing note reviewed.  Constitutional:      Appearance: Normal appearance.  HENT:     Head: Normocephalic and atraumatic.  Eyes:     Extraocular Movements: Extraocular movements intact.  Cardiovascular:     Rate and Rhythm: Normal rate and regular rhythm.     Pulses: Normal pulses.     Heart sounds: Normal heart sounds.  Pulmonary:     Effort: Pulmonary effort is normal.     Breath sounds: Normal breath sounds.  Musculoskeletal:        General: Normal range of motion.     Cervical back: Normal range of motion.  Skin:    General: Skin is warm and dry.  Neurological:     General: No focal deficit present.     Mental Status: He is alert and oriented to person, place, and time.  Psychiatric:        Mood and Affect: Mood normal.        Behavior: Behavior normal.       Assessment & Plan:   There are no diagnoses linked to this encounter.   No  follow-ups on file.   Moshe Cipro, FNP
